# Patient Record
Sex: Female | Born: 1965 | Race: Black or African American | Hispanic: No | Marital: Single | Smoking: Current every day smoker
Health system: Southern US, Community
[De-identification: ages and names within clinical notes are randomized; demographics above are authoritative.]

## PROBLEM LIST (undated history)

## (undated) DIAGNOSIS — J45909 Unspecified asthma, uncomplicated: Secondary | ICD-10-CM

## (undated) DIAGNOSIS — I10 Essential (primary) hypertension: Secondary | ICD-10-CM

## (undated) DIAGNOSIS — Z992 Dependence on renal dialysis: Secondary | ICD-10-CM

## (undated) DIAGNOSIS — E079 Disorder of thyroid, unspecified: Secondary | ICD-10-CM

## (undated) DIAGNOSIS — N186 End stage renal disease: Secondary | ICD-10-CM

## (undated) DIAGNOSIS — Z5189 Encounter for other specified aftercare: Secondary | ICD-10-CM

## (undated) HISTORY — PX: AV FISTULA REPAIR: SHX563

## (undated) HISTORY — PX: TONSILLECTOMY: SUR1361

---

## 2012-12-16 ENCOUNTER — Other Ambulatory Visit: Payer: Self-pay | Admitting: Endocrinology

## 2012-12-16 DIAGNOSIS — E041 Nontoxic single thyroid nodule: Secondary | ICD-10-CM

## 2012-12-28 ENCOUNTER — Ambulatory Visit
Admission: RE | Admit: 2012-12-28 | Discharge: 2012-12-28 | Disposition: A | Discharge status: Home / Self Care | Payer: Medicare Other | Source: Ambulatory Visit | Attending: Endocrinology | Admitting: Endocrinology

## 2012-12-28 ENCOUNTER — Other Ambulatory Visit (HOSPITAL_COMMUNITY)
Admission: RE | Admit: 2012-12-28 | Discharge: 2012-12-28 | Disposition: A | Discharge status: Home / Self Care | Payer: Medicare Other | Source: Ambulatory Visit | Attending: Diagnostic Radiology | Admitting: Diagnostic Radiology

## 2012-12-28 ENCOUNTER — Ambulatory Visit
Admission: RE | Admit: 2012-12-28 | Discharge: 2012-12-28 | Disposition: A | Discharge status: Home / Self Care | Payer: Medicaid Other | Source: Ambulatory Visit | Attending: Endocrinology | Admitting: Endocrinology

## 2012-12-28 DIAGNOSIS — E041 Nontoxic single thyroid nodule: Secondary | ICD-10-CM | POA: Insufficient documentation

## 2014-06-20 IMAGING — US US THYROID BIOPSY
1 series · 13 of 25 positions shown · non-contrast
Comparison: none

CLINICAL DATA: Bilateral thyroid nodules. Possible cold nodule in
the left lower pole.

[Series 1: us thyroid biopsy · 0.11mm/px · 29 acquisitions, 13 frames shown]
[im 1/29]
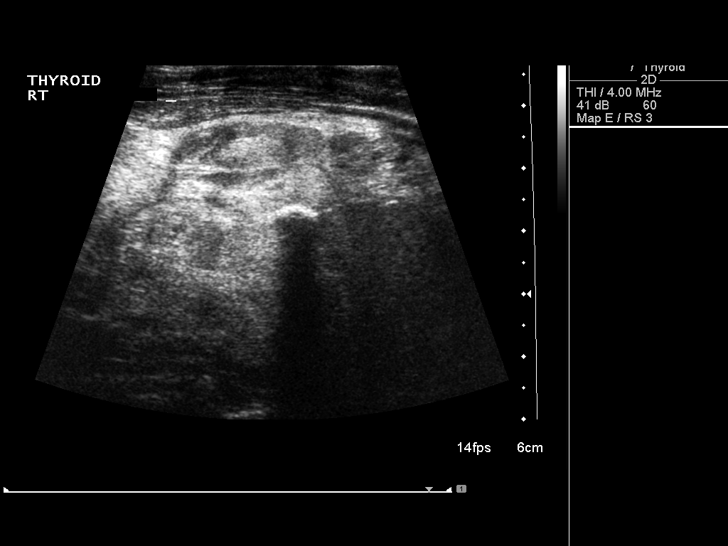
[im 3/29]
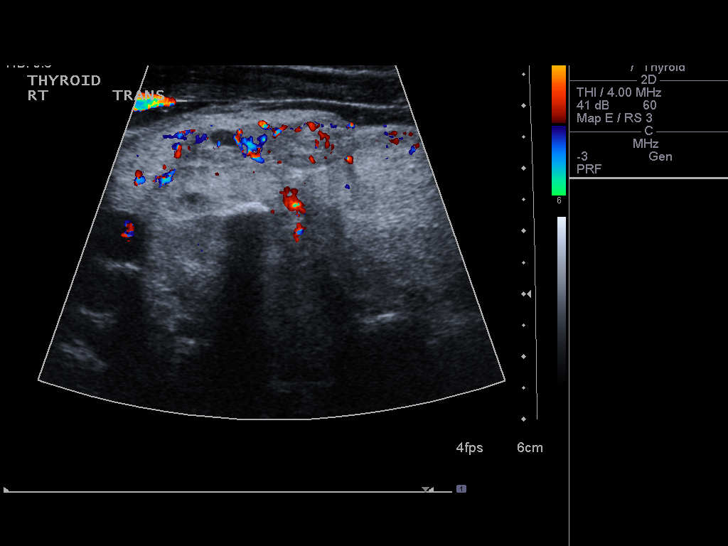
[im 5/29]
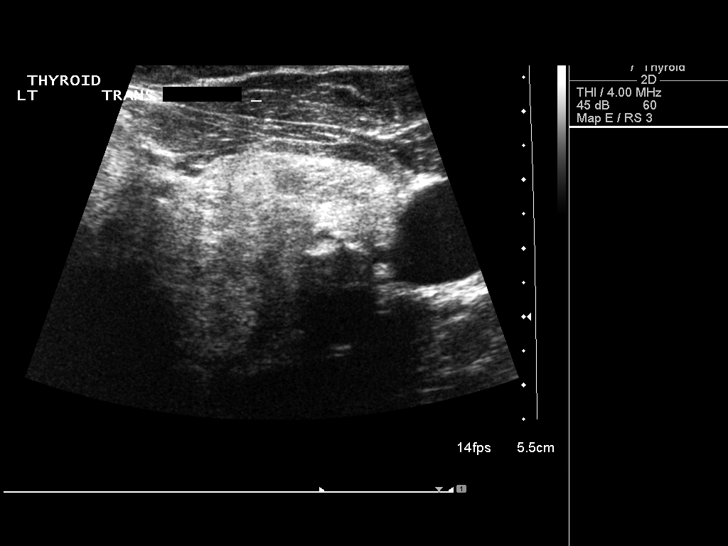
[im 8/29]
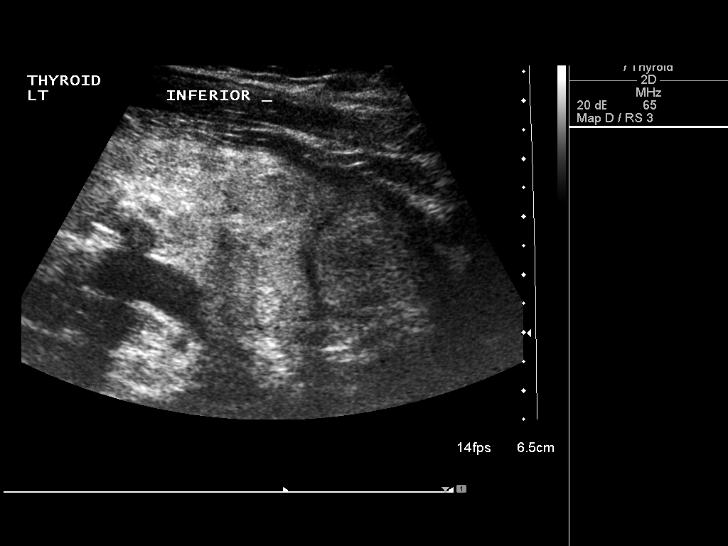
[im 10/29]
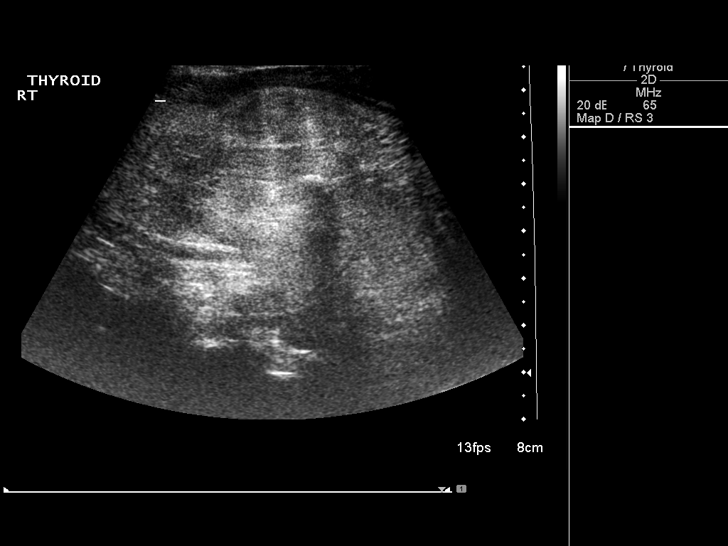
[im 12/29]
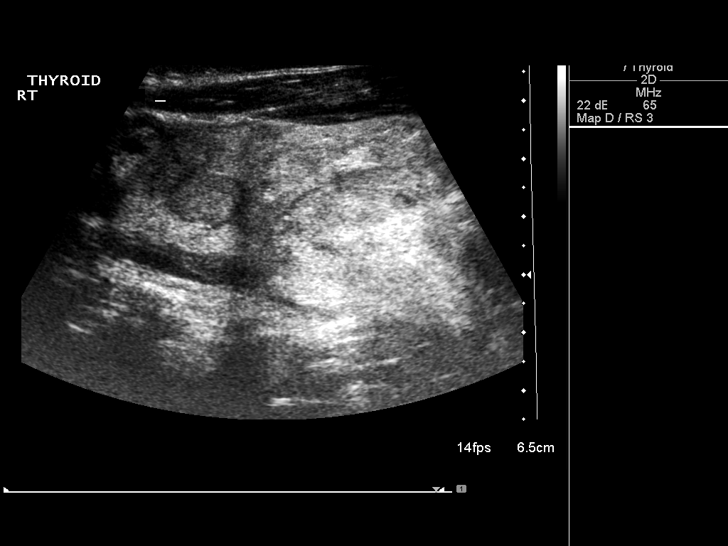
[im 15/29]
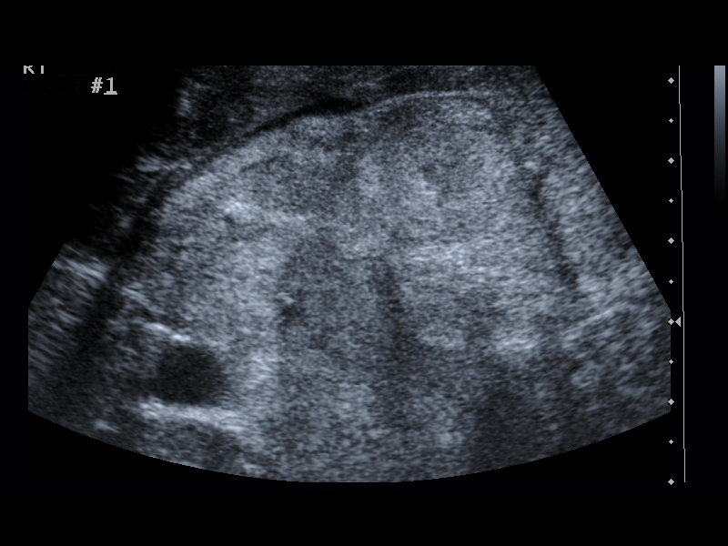
[im 17/29]
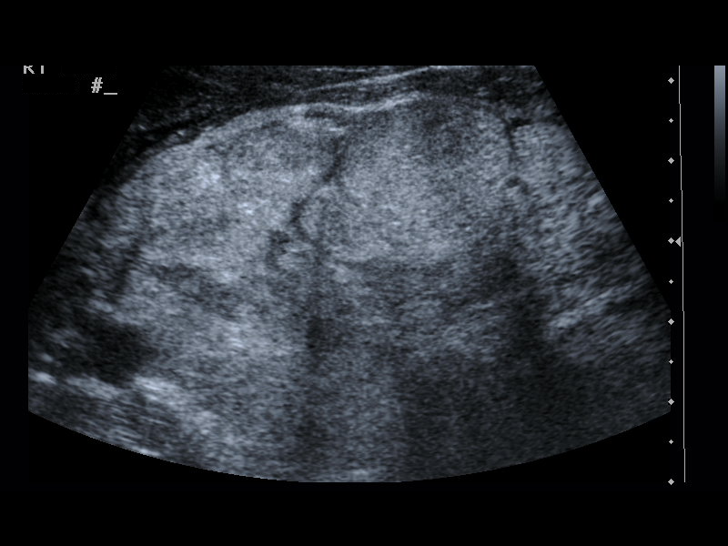
[im 19/29]
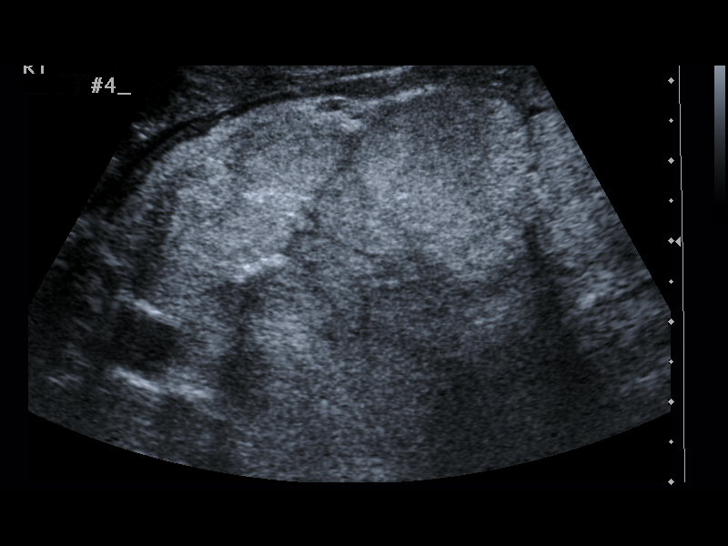
[im 22/29]
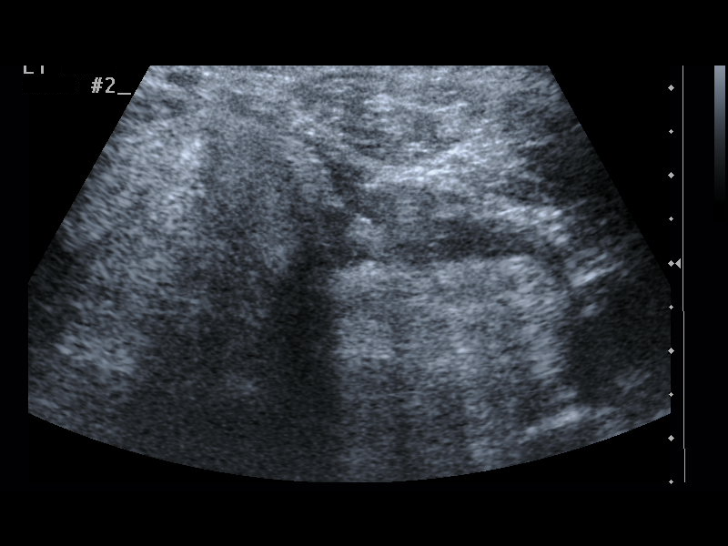
[im 24/29]
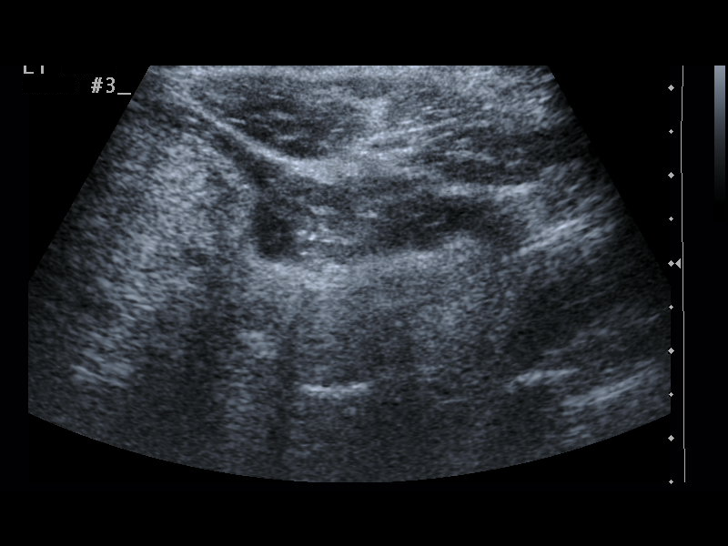
[im 26/29]
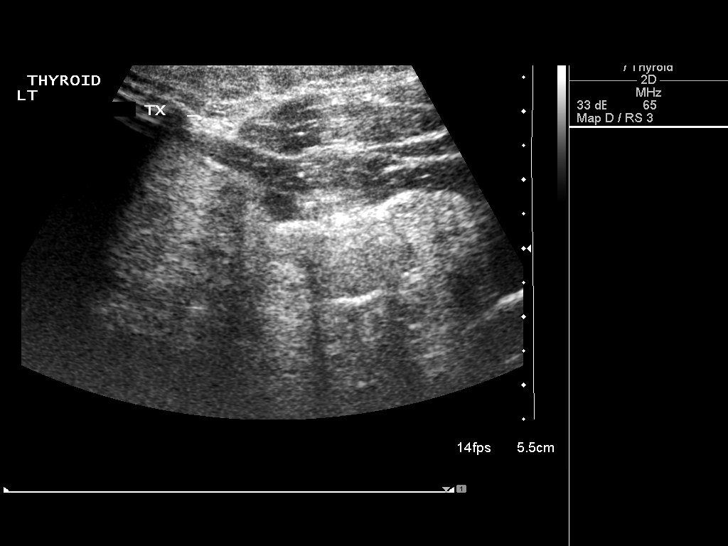
[im 29/29]
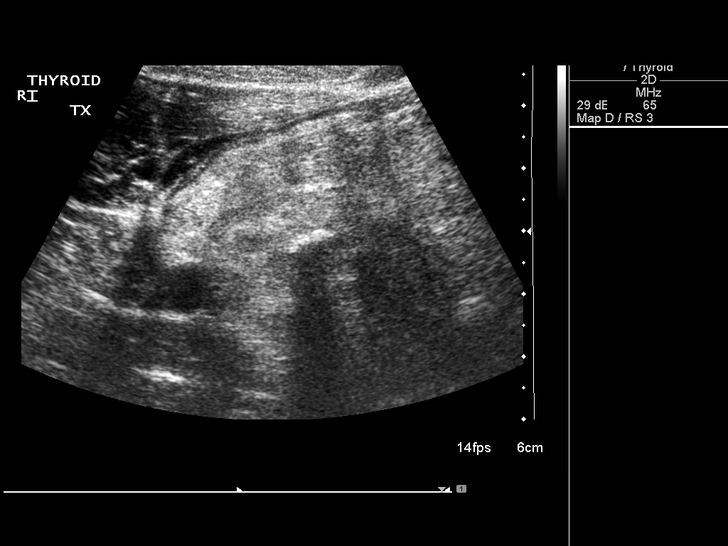

[13 of 25 positions shown; findings below may reference images not displayed]

EXAM:
ULTRASOUND GUIDED FINE NEEDLE ASPIRATION OF BILATERAL THYROID NODULE

MEDICATIONS:
None

ANESTHESIA/SEDATION:
Moderate sedation time:  None

FLUOROSCOPY TIME:  None

PROCEDURE:
The procedure was explained to the patient. The risks and benefits
of the procedure were discussed and the patient's questions were
addressed. Informed consent was obtained from the patient. The neck
was evaluated with ultrasound. The neck was prepped with Betadine
and a sterile drape was placed. The left side of the neck was
anesthetized with lidocaine. The nodule in the inferior left thyroid
lobe was very difficult to access due to the depth of the lesion and
prominent neck soft tissues. Eventually, 3 fine-needle aspirations
were obtained with 22 gauge Inrad needles. The right side of neck
was anesthetized with 1% lidocaine. Four ultrasound guided fine
needle aspirations were obtained in the right thyroid nodule using
25 gauge needles. Bandage was placed over the puncture sites.

COMPLICATIONS:
None
FINDINGS: The right thyroid tissue is diffusely heterogeneous. Difficult to
differentiate individual lesions in the right thyroid lobe. The
posterior right thyroid lobe near a large calcification corresponds
with the nodule on the previous examination. This area was sampled.
The patient a solid nodule in the inferior left thyroid lobe. Biopsy
of this lesion was technically very difficult due to the depth of
the lesion and patient discomfort throughout the procedure.
IMPRESSION: Ultrasound guided biopsy of bilateral thyroid nodules.

## 2014-12-15 ENCOUNTER — Emergency Department (HOSPITAL_BASED_OUTPATIENT_CLINIC_OR_DEPARTMENT_OTHER)
Admission: EM | Admit: 2014-12-15 | Discharge: 2014-12-15 | Disposition: A | Discharge status: Home / Self Care | Payer: Medicare Other | Attending: Emergency Medicine | Admitting: Emergency Medicine

## 2014-12-15 ENCOUNTER — Encounter (HOSPITAL_BASED_OUTPATIENT_CLINIC_OR_DEPARTMENT_OTHER): Payer: Self-pay | Admitting: *Deleted

## 2014-12-15 DIAGNOSIS — Z992 Dependence on renal dialysis: Secondary | ICD-10-CM | POA: Insufficient documentation

## 2014-12-15 DIAGNOSIS — Z79899 Other long term (current) drug therapy: Secondary | ICD-10-CM | POA: Diagnosis not present

## 2014-12-15 DIAGNOSIS — Z5189 Encounter for other specified aftercare: Secondary | ICD-10-CM

## 2014-12-15 DIAGNOSIS — E079 Disorder of thyroid, unspecified: Secondary | ICD-10-CM | POA: Diagnosis not present

## 2014-12-15 DIAGNOSIS — I120 Hypertensive chronic kidney disease with stage 5 chronic kidney disease or end stage renal disease: Secondary | ICD-10-CM | POA: Diagnosis not present

## 2014-12-15 DIAGNOSIS — J45909 Unspecified asthma, uncomplicated: Secondary | ICD-10-CM | POA: Insufficient documentation

## 2014-12-15 DIAGNOSIS — Z4801 Encounter for change or removal of surgical wound dressing: Secondary | ICD-10-CM | POA: Diagnosis not present

## 2014-12-15 DIAGNOSIS — F17200 Nicotine dependence, unspecified, uncomplicated: Secondary | ICD-10-CM | POA: Insufficient documentation

## 2014-12-15 DIAGNOSIS — N186 End stage renal disease: Secondary | ICD-10-CM | POA: Insufficient documentation

## 2014-12-15 HISTORY — DX: Essential (primary) hypertension: I10

## 2014-12-15 HISTORY — DX: Encounter for other specified aftercare: Z51.89

## 2014-12-15 HISTORY — DX: End stage renal disease: N18.6

## 2014-12-15 HISTORY — DX: Unspecified asthma, uncomplicated: J45.909

## 2014-12-15 HISTORY — DX: Dependence on renal dialysis: Z99.2

## 2014-12-15 HISTORY — DX: Disorder of thyroid, unspecified: E07.9

## 2014-12-15 MED ORDER — OXYCODONE-ACETAMINOPHEN 5-325 MG PO TABS: 1.0000 | ORAL_TABLET | ORAL | Status: AC | PRN

## 2014-12-15 NOTE — ED Notes (Signed)
Pt d/c at 8pm. Unable to d/c from Epic at that time

## 2014-12-15 NOTE — Discharge Instructions (Signed)
You have been seen today for a wound check. It is recommended that you follow up with your surgeon as soon as possible on this matter. Follow up with PCP as needed. Return to ED should symptoms worsen.

## 2014-12-15 NOTE — ED Provider Notes (Signed)
CSN: [id number]     Arrival date & time 12/15/14  1619 History   First MD Initiated Contact with Patient 12/15/14 1829     Chief Complaint  Patient presents with  . Arm Pain  . Wound Check     (Consider location/radiation/quality/duration/timing/severity/associated sxs/prior Treatment) HPI   Tiffany Ballard is a 49 y.o. female, with a history of ESRD on dialysis, presenting to the ED with left forearm pain that has not stopped since last Friday when she had a fistula placed.  Pt states that she is here because her grandson accidentally ran into her arm 3 days ago and thinks this may also been when the pain increased. Pt wants to know if it is infected. Pt also states, "I'm also out of my pain medication."  Pt surgeon was [facility] Prim[facility]. Pt rates pain 10/10, sharp in nature, non-radiating. Pt has not taken anything for the pain in the last few days. Pt also complains of some clear drainage from the wound today.     Past Medical History  Diagnosis Date  . ESRD on dialysis (HCC)   . Hypertension   . Asthma   . Blood transfusion without reported diagnosis   . Thyroid disease    Past Surgical History  Procedure Laterality Date  . Av fistula repair    . Cesarean section    . Tonsillectomy     No family history on file. Social History  Substance Use Topics  . Smoking status: Current Every Day Smoker  . Smokeless tobacco: Never Used  . Alcohol Use: No   OB History    No data available     Review of Systems  Skin:       Incision pain and drainage on left forearm.  All other systems reviewed and are negative.     Allergies  Review of patient's allergies indicates not on file.  Home Medications   Prior to Admission medications   Medication Sig Start Date End Date Taking? Authorizing Provider  calcium acetate, Phos Binder, (PHOSLYRA) 667 MG/5ML SOLN Take by mouth 3 (three) times daily with meals.   Yes Historical Provider, MD  Cinacalcet HCl (SENSIPAR  PO) Take by mouth.   Yes Historical Provider, MD  cloNIDine (CATAPRES) 0.2 MG tablet Take 0.2 mg by mouth 2 (two) times daily.   Yes Historical Provider, MD  Ferric Citrate (AURYXIA) 1 GM 210 MG(FE) TABS Take by mouth.   Yes Historical Provider, MD  furosemide (LASIX) 80 MG tablet Take 80 mg by mouth every other day.   Yes Historical Provider, MD  labetalol (NORMODYNE) 300 MG tablet Take 300 mg by mouth 2 (two) times daily.   Yes Historical Provider, MD  losartan (COZAAR) 25 MG tablet Take 25 mg by mouth daily.   Yes Historical Provider, MD  methimazole (TAPAZOLE) 5 MG tablet Take 5 mg by mouth.   Yes Historical Provider, MD  pantoprazole (PROTONIX) 20 MG tablet Take 20 mg by mouth daily.   Yes Historical Provider, MD  sevelamer carbonate (RENVELA) 800 MG tablet Take 800 mg by mouth 3 (three) times daily with meals.   Yes Historical Provider, MD  oxyCODONE-acetaminophen (PERCOCET/ROXICET) 5-325 MG tablet Take 1-2 tablets by mouth every 4 (four) hours as needed for severe pain. 12/15/14   Shawn C Joy, PA-C   BP 189/92 mmHg  Pulse 72  Temp(Src) 99.3 F (37.4 C) (Oral)  Resp 18  Ht 5\' 2"  (1.575 m)  Wt 211 lb (95.709 kg)  BMI 38.58 kg/m2  SpO2 99% Physical Exam  Constitutional: She appears well-developed and well-nourished. No distress.  HENT:  Head: Normocephalic and atraumatic.  Eyes: Conjunctivae are normal. Pupils are equal, round, and reactive to light.  Cardiovascular: Normal rate, regular rhythm and intact distal pulses.   Pulmonary/Chest: No respiratory distress.  Musculoskeletal: Normal range of motion. She exhibits no edema or tenderness.  Neurological: She is alert.  Skin: Skin is warm and dry. She is not diaphoretic.  Surgical incision on left forearm with area of slight dehiscence approximately 5 cm long. Serous drainage. No foul odor. Tender to the touch. No erythema, increased warmth or other signs of cellulitis.   Nursing note and vitals reviewed.   ED Course   Procedures (including critical care time) Labs Review Labs Reviewed - No data to display  Imaging Review No results found. I have personally reviewed and evaluated these images and lab results as part of my medical decision-making.   EKG Interpretation None      MDM   Final diagnoses:  Visit for wound check    Chizuko Trine presents with pain surrounding her surgical incision site since the surgery a week ago.  Findings and plan of care discussed with Jerelyn Scott, MD.  Patient's incision shows slight dehiscence over a length of about 5 cm. No signs of cellulitis or deep tissue infection. Patient needs to follow up with her surgeon at [facility] as soon as possible. Fresh Steri-Strips were placed across patient's incision. She will be given a small prescription for pain medication for the weekend. This plan of care was communicated with the patient, and patient agrees and is comfortable with discharge.    Anselm Pancoast, PA-C 12/15/14 2217  Jerelyn Scott, MD 12/15/14 2236

## 2014-12-15 NOTE — ED Notes (Signed)
Pt given Rx x 1 for percocet. Advised to f/u with PCP and surgeon as soon as possible. S/S of infection discussed with pt who verbalizes understanding

## 2014-12-15 NOTE — ED Notes (Signed)
PA at bedside.

## 2014-12-15 NOTE — ED Notes (Signed)
Pt states last dialysis yesterday

## 2014-12-15 NOTE — ED Notes (Signed)
Pt reports reports she had surgery left arm 1 week ago- states had work on fistula- states has been in pain since then-- pt also has a new access placed right AC- pt also c/o pain in both knees

## 2021-07-27 DEATH — deceased
